# Patient Record
Sex: Male | Born: 1980 | Race: Black or African American | Hispanic: No | Marital: Married | Smoking: Current every day smoker
Health system: Southern US, Community
[De-identification: ages and names within clinical notes are randomized; demographics above are authoritative.]

---

## 2019-10-12 ENCOUNTER — Encounter (HOSPITAL_COMMUNITY): Payer: Self-pay | Admitting: Emergency Medicine

## 2019-10-12 ENCOUNTER — Emergency Department (HOSPITAL_COMMUNITY)
Admission: EM | Admit: 2019-10-12 | Discharge: 2019-10-13 | Disposition: A | Discharge status: Home / Self Care | Payer: No Typology Code available for payment source | Attending: Emergency Medicine | Admitting: Emergency Medicine

## 2019-10-12 ENCOUNTER — Other Ambulatory Visit: Payer: Self-pay

## 2019-10-12 ENCOUNTER — Emergency Department (HOSPITAL_COMMUNITY): Payer: No Typology Code available for payment source

## 2019-10-12 DIAGNOSIS — S20212A Contusion of left front wall of thorax, initial encounter: Secondary | ICD-10-CM | POA: Insufficient documentation

## 2019-10-12 DIAGNOSIS — F17210 Nicotine dependence, cigarettes, uncomplicated: Secondary | ICD-10-CM | POA: Insufficient documentation

## 2019-10-12 DIAGNOSIS — S161XXA Strain of muscle, fascia and tendon at neck level, initial encounter: Secondary | ICD-10-CM | POA: Diagnosis not present

## 2019-10-12 DIAGNOSIS — Y92410 Unspecified street and highway as the place of occurrence of the external cause: Secondary | ICD-10-CM | POA: Insufficient documentation

## 2019-10-12 DIAGNOSIS — S199XXA Unspecified injury of neck, initial encounter: Secondary | ICD-10-CM | POA: Diagnosis present

## 2019-10-12 DIAGNOSIS — R911 Solitary pulmonary nodule: Secondary | ICD-10-CM | POA: Diagnosis not present

## 2019-10-12 DIAGNOSIS — Y93I9 Activity, other involving external motion: Secondary | ICD-10-CM | POA: Insufficient documentation

## 2019-10-12 DIAGNOSIS — Y999 Unspecified external cause status: Secondary | ICD-10-CM | POA: Diagnosis not present

## 2019-10-12 LAB — BASIC METABOLIC PANEL
Anion gap: 9 (ref 5–15)
BUN: 15 mg/dL (ref 6–20)
CO2: 26 mmol/L (ref 22–32)
Calcium: 9.3 mg/dL (ref 8.9–10.3)
Chloride: 103 mmol/L (ref 98–111)
Creatinine, Ser: 1.21 mg/dL (ref 0.61–1.24)
GFR calc Af Amer: >60 mL/min (ref >60)
GFR calc non Af Amer: >60 mL/min (ref >60)
Glucose, Bld: 102 mg/dL — ABNORMAL HIGH (ref 70–99)
Potassium: 4 mmol/L (ref 3.5–5.1)
Sodium: 138 mmol/L (ref 135–145)

## 2019-10-12 LAB — CBC WITH DIFFERENTIAL/PLATELET
Abs Immature Granulocytes: 0.01 10*3/uL (ref 0.00–0.07)
Basophils Absolute: 0 10*3/uL (ref 0.0–0.1)
Basophils Relative: 0 %
Eosinophils Absolute: 0 10*3/uL (ref 0.0–0.5)
Eosinophils Relative: 0 %
HCT: 45.2 % (ref 39.0–52.0)
Hemoglobin: 14.7 g/dL (ref 13.0–17.0)
Immature Granulocytes: 0 %
Lymphocytes Relative: 28 %
Lymphs Abs: 1.5 10*3/uL (ref 0.7–4.0)
MCH: 27.7 pg (ref 26.0–34.0)
MCHC: 32.5 g/dL (ref 30.0–36.0)
MCV: 85.1 fL (ref 80.0–100.0)
Monocytes Absolute: 0.4 10*3/uL (ref 0.1–1.0)
Monocytes Relative: 6 %
Neutro Abs: 3.6 10*3/uL (ref 1.7–7.7)
Neutrophils Relative %: 66 %
Platelets: 166 10*3/uL (ref 150–400)
RBC: 5.31 MIL/uL (ref 4.22–5.81)
RDW: 13.2 % (ref 11.5–15.5)
WBC: 5.5 10*3/uL (ref 4.0–10.5)
nRBC: 0 % (ref 0.0–0.2)

## 2019-10-12 MED ORDER — IOHEXOL 350 MG/ML SOLN
100.0000 mL | Freq: Once | INTRAVENOUS | Status: AC | PRN
  Administered 2019-10-12: 100 mL via INTRAVENOUS

## 2019-10-12 MED ORDER — ACETAMINOPHEN 500 MG PO TABS
1000.0000 mg | ORAL_TABLET | Freq: Once | ORAL | Status: AC
  Administered 2019-10-12: 1000 mg via ORAL

## 2019-10-12 MED ORDER — SODIUM CHLORIDE (PF) 0.9 % IJ SOLN
INTRAMUSCULAR | Status: AC
  Filled 2019-10-12: qty 50
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
## 2019-10-13 NOTE — Discharge Instructions (Addendum)
You may alternate Tylenol 1000 mg every 6 hours as needed for pain and Ibuprofen 800 mg every 8 hours as needed for pain.  Please take Ibuprofen with food.  Do not take more than 4000 mg of Tylenol (acetaminophen) in a 24 hour period.   I recommend that you stop smoking.  You were found to have a left lung nodule.  This will need to be followed every year by her primary care physician.  List of primary care doctors as listed below.   Steps to find a Primary Care Provider (PCP):  Call [phone] or [phone] to access "[facility] Find a Doctor Service."  2.  You may also go on the [facility] website at [email or web address]  3.  [facility] also frequently accepts new patients.  [facility]  [address] [place] [address] [phone]  4.  There are also multiple [facility], [facility] and [facility] practices throughout the [place] that are frequently accepting new patients. You may find a clinic that is close to your home and contact them.  [email or web address] [phone]  [facility] [email or web address]  [email or web address] [phone]  [facility] [email or web address] [phone]  5.  Local Health Departments also can provide primary care services.  [facility]  [address] [place] [address] [phone]  [facility] Department [address] [place] [address] [phone]  [facility] [address] [place] [address]  [place] [address] [phone]
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
## 2021-03-19 DEATH — deceased
